# Patient Record
Sex: Male | Born: 1990 | Race: Black or African American | Hispanic: No | Marital: Single | Smoking: Never smoker
Health system: Southern US, Community
[De-identification: ages and names within clinical notes are randomized; demographics above are authoritative.]

---

## 2014-08-23 ENCOUNTER — Emergency Department (HOSPITAL_COMMUNITY): Payer: No Typology Code available for payment source

## 2014-08-23 ENCOUNTER — Encounter (HOSPITAL_COMMUNITY): Payer: Self-pay | Admitting: Emergency Medicine

## 2014-08-23 ENCOUNTER — Emergency Department (HOSPITAL_COMMUNITY)
Admission: EM | Admit: 2014-08-23 | Discharge: 2014-08-23 | Disposition: A | Discharge status: Home / Self Care | Payer: No Typology Code available for payment source | Attending: Emergency Medicine | Admitting: Emergency Medicine

## 2014-08-23 DIAGNOSIS — Y92310 Basketball court as the place of occurrence of the external cause: Secondary | ICD-10-CM | POA: Diagnosis not present

## 2014-08-23 DIAGNOSIS — Y998 Other external cause status: Secondary | ICD-10-CM | POA: Diagnosis not present

## 2014-08-23 DIAGNOSIS — W500XXA Accidental hit or strike by another person, initial encounter: Secondary | ICD-10-CM | POA: Diagnosis not present

## 2014-08-23 DIAGNOSIS — Y9367 Activity, basketball: Secondary | ICD-10-CM | POA: Diagnosis not present

## 2014-08-23 DIAGNOSIS — S8992XA Unspecified injury of left lower leg, initial encounter: Secondary | ICD-10-CM | POA: Diagnosis present

## 2014-08-23 DIAGNOSIS — M25569 Pain in unspecified knee: Secondary | ICD-10-CM

## 2014-08-23 DIAGNOSIS — S8392XA Sprain of unspecified site of left knee, initial encounter: Secondary | ICD-10-CM | POA: Diagnosis not present

## 2014-08-23 MED ORDER — NAPROXEN 500 MG PO TABS: 500.0000 mg | ORAL_TABLET | Freq: Two times a day (BID) | ORAL | Status: AC

## 2014-08-23 NOTE — Discharge Instructions (Signed)
Knee Sprain A knee sprain is a tear in one of the strong, fibrous tissues that connect the bones (ligaments) in your knee. The severity of the sprain depends on how much of the ligament is torn. The tear can be either partial or complete. CAUSES  Often, sprains are a result of a fall or injury. The force of the impact causes the fibers of your ligament to stretch too much. This excess tension causes the fibers of your ligament to tear. SIGNS AND SYMPTOMS  You may have some loss of motion in your knee. Other symptoms include:  Bruising.  Pain in the knee area.  Tenderness of the knee to the touch.  Swelling. DIAGNOSIS  To diagnose a knee sprain, your health care provider will physically examine your knee. Your health care provider may also suggest an X-ray exam of your knee to make sure no bones are broken. TREATMENT  If your ligament is only partially torn, treatment usually involves keeping the knee in a fixed position (immobilization) or bracing your knee for activities that require movement for several weeks. To do this, your health care provider will apply a bandage, cast, or splint to keep your knee from moving and to support your knee during movement until it heals. For a partially torn ligament, the healing process usually takes 4-6 weeks. If your ligament is completely torn, depending on which ligament it is, you may need surgery to reconnect the ligament to the bone or reconstruct it. After surgery, a cast or splint may be applied and will need to stay on your knee for 4-6 weeks while your ligament heals. HOME CARE INSTRUCTIONS  Keep your injured knee elevated to decrease swelling.  To ease pain and swelling, apply ice to the injured area:  Put ice in a plastic bag.  Place a towel between your skin and the bag.  Leave the ice on for 20 minutes, 2-3 times a day.  Only take medicine for pain as directed by your health care provider.  Do not leave your knee unprotected until  pain and stiffness go away (usually 4-6 weeks).  If you have a cast or splint, do not allow it to get wet. If you have been instructed not to remove it, cover it with a plastic bag when you shower or bathe. Do not swim.  Your health care provider may suggest exercises for you to do during your recovery to prevent or limit permanent weakness and stiffness. SEEK IMMEDIATE MEDICAL CARE IF:  Your cast or splint becomes damaged.  Your pain becomes worse.  You have significant pain, swelling, or numbness below the cast or splint. MAKE SURE YOU:  Understand these instructions.  Will watch your condition.  Will get help right away if you are not doing well or get worse. Document Released: 06/10/2005 Document Revised: 03/31/2013 Document Reviewed: 01/20/2013 [facility] Patient Information 2015 [place], [place]. This information is not intended to replace advice given to you by your health care provider. Make sure you discuss any questions you have with your health care provider.  Cryotherapy Cryotherapy means treatment with cold. Ice or gel packs can be used to reduce both pain and swelling. Ice is the most helpful within the first 24 to 48 hours after an injury or flare-up from overusing a muscle or joint. Sprains, strains, spasms, burning pain, shooting pain, and aches can all be eased with ice. Ice can also be used when recovering from surgery. Ice is effective, has very few side effects, and is  safe for most people to use. PRECAUTIONS  Ice is not a safe treatment option for people with:  Raynaud phenomenon. This is a condition affecting small blood vessels in the extremities. Exposure to cold may cause your problems to return.  Cold hypersensitivity. There are many forms of cold hypersensitivity, including:  Cold urticaria. Red, itchy hives appear on the skin when the tissues begin to warm after being iced.  Cold erythema. This is a red, itchy rash caused by exposure to cold.  Cold  hemoglobinuria. Red blood cells break down when the tissues begin to warm after being iced. The hemoglobin that carry oxygen are passed into the urine because they cannot combine with blood proteins fast enough.  Numbness or altered sensitivity in the area being iced. If you have any of the following conditions, do not use ice until you have discussed cryotherapy with your caregiver:  Heart conditions, such as arrhythmia, angina, or chronic heart disease.  High blood pressure.  Healing wounds or open skin in the area being iced.  Current infections.  Rheumatoid arthritis.  Poor circulation.  Diabetes. Ice slows the blood flow in the region it is applied. This is beneficial when trying to stop inflamed tissues from spreading irritating chemicals to surrounding tissues. However, if you expose your skin to cold temperatures for too long or without the proper protection, you can damage your skin or nerves. Watch for signs of skin damage due to cold. HOME CARE INSTRUCTIONS Follow these tips to use ice and cold packs safely.  Place a dry or damp towel between the ice and skin. A damp towel will cool the skin more quickly, so you may need to shorten the time that the ice is used.  For a more rapid response, add gentle compression to the ice.  Ice for no more than 10 to 20 minutes at a time. The bonier the area you are icing, the less time it will take to get the benefits of ice.  Check your skin after 5 minutes to make sure there are no signs of a poor response to cold or skin damage.  Rest 20 minutes or more between uses.  Once your skin is numb, you can end your treatment. You can test numbness by very lightly touching your skin. The touch should be so light that you do not see the skin dimple from the pressure of your fingertip. When using ice, most people will feel these normal sensations in this order: cold, burning, aching, and numbness.  Do not use ice on someone who cannot  communicate their responses to pain, such as small children or people with dementia. HOW TO MAKE AN ICE PACK Ice packs are the most common way to use ice therapy. Other methods include ice massage, ice baths, and cryosprays. Muscle creams that cause a cold, tingly feeling do not offer the same benefits that ice offers and should not be used as a substitute unless recommended by your caregiver. To make an ice pack, do one of the following:  Place crushed ice or a bag of frozen vegetables in a sealable plastic bag. Squeeze out the excess air. Place this bag inside another plastic bag. Slide the bag into a pillowcase or place a damp towel between your skin and the bag.  Mix 3 parts water with 1 part rubbing alcohol. Freeze the mixture in a sealable plastic bag. When you remove the mixture from the freezer, it will be slushy. Squeeze out the excess air. Place this  bag inside another plastic bag. Slide the bag into a pillowcase or place a damp towel between your skin and the bag. SEEK MEDICAL CARE IF:  You develop white spots on your skin. This may give the skin a blotchy (mottled) appearance.  Your skin turns blue or pale.  Your skin becomes waxy or hard.  Your swelling gets worse. MAKE SURE YOU:   Understand these instructions.  Will watch your condition.  Will get help right away if you are not doing well or get worse. Document Released: 02/04/2011 Document Revised: 10/25/2013 Document Reviewed: 02/04/2011 [facility] Patient Information 2015 [place], [place]. This information is not intended to replace advice given to you by your health care provider. Make sure you discuss any questions you have with your health care provider.  Knee Bracing Knee braces are supports to help stabilize and protect an injured or painful knee. They come in many different styles. They should support and protect the knee without increasing the chance of other injuries to yourself or others. It is important not to  have a false sense of security when using a brace. Knee braces that help you to keep using your knee:  Do not restore normal knee stability under high stress forces.  May decrease some aspects of athletic performance. Some of the different types of knee braces are:  Prophylactic knee braces are designed to prevent or reduce the severity of knee injuries during sports that make injury to the knee more likely.  Rehabilitative knee braces are designed to allow protected motion of:  Injured knees.  Knees that have been treated with or without surgery. There is no evidence that the use of a supportive knee brace protects the graft following a successful anterior cruciate ligament (ACL) reconstruction. However, braces are sometimes used to:   Protect injured ligaments.  Control knee movement during the initial healing period. They may be used as part of the treatment program for the various injured ligaments or cartilage of the knee including the:  Anterior cruciate ligament.  Medial collateral ligament.  Medial or lateral cartilage (meniscus).  Posterior cruciate ligament.  Lateral collateral ligament. Rehabilitative knee braces are most commonly used:  During crutch-assisted walking right after injury.  During crutch-assisted walking right after surgery to repair the cartilage and/or cruciate ligament injury.  For a short period of time, 2-8 weeks, after the injury or surgery. The value of a rehabilitative brace as opposed to a cast or splint includes the:  Ability to adjust the brace for swelling.  Ability to remove the brace for examinations, icing, or showering.  Ability to allow for movement in a controlled range of motion. Functional knee braces give support to knees that have already been injured. They are designed to provide stability for the injured knee and provide protection after repair. Functional knee braces may not affect performance much. Lower extremity muscle  strengthening, flexibility, and improvement in technique are more important than bracing in treating ligamentous knee injuries. Functional braces are not a substitute for rehabilitation or surgical procedures. Unloader/off-loader braces are designed to provide pain relief in arthritic knees. Patients with wear and tear arthritis from growing old or from an old cartilage injury (osteoarthritis) of the knee, and bowlegged (varus) or knock-knee (valgus) deformities, often develop increased pain in the arthritic side due to increased loading. Unloader/off-loader braces are made to reduce uneven loading in such knees. There is reduction in bowing out movement in bowlegged knees when the correct unloader brace is used. Patients with advanced  osteoarthritis or severe varus or valgus alignment problems would not likely benefit from bracing. Patellofemoral braces help the kneecap to move smoothly and well centered over the end of the femur in the knee.  Most people who wear knee braces feel that they help. However, there is a lack of scientific evidence that knee braces are helpful at the level needed for athletic participation to prevent injury. In spite of this, athletes report an increase in knee stability, pain relief, performance improvement, and confidence during athletics when using a brace.  Different knee problems require different knee braces:  Your caregiver may suggest one kind of knee brace after knee surgery.  A caregiver may choose another kind of knee brace for support instead of surgery for some types of torn ligaments.  You may also need one for pain in the front of your knee that is not getting better with strengthening and flexibility exercises. Get your caregiver's advice if you want to try a knee brace. The caregiver will advise you on where to get them and provide a prescription when it is needed to fashion and/or fit the brace. Knee braces are the least important part of preventing knee  injuries or getting better following injury. Stretching, strengthening and technique improvement are far more important in caring for and preventing knee injuries. When strengthening your knee, increase your activities a little at a time so as not to develop injuries from overuse. Work out an exercise plan with your caregiver and/or physical therapist to get the best program for you. Do not let a knee brace become a crutch. Always remember, there are no braces which support the knee as well as your original ligaments and cartilage you were born with. Conditioning, proper warm-up, and stretching remain the most important parts of keeping your knees healthy. HOW TO USE A KNEE BRACE  During sports, knee braces should be used as directed by your caregiver.  Make sure that the hinges are where the knee bends.  Straps, tapes, or hook-and-loop tapes should be fastened around your leg as instructed.  You should check the placement of the brace during activities to make sure that it has not moved. Poorly positioned braces can hurt rather than help you.  To work well, a knee brace should be worn during all activities that put you at risk of knee injury.  Warm up properly before beginning athletic activities. HOME CARE INSTRUCTIONS  Knee braces often get damaged during normal use. Replace worn-out braces for maximum benefit.  Clean regularly with soap and water.  Inspect your brace often for wear and tear.  Cover exposed metal to protect others from injury.  Durable materials may cost more, but last longer. SEEK IMMEDIATE MEDICAL CARE IF:   Your knee seems to be getting worse rather than better.  You have increasing pain or swelling in the knee.  You have problems caused by the knee brace.  You have increased swelling or inflammation (redness or soreness) in your knee.  Your knee becomes warm and more painful and you develop an unexplained temperature over 101F (38.3C). MAKE SURE YOU:    Understand these instructions.  Will watch your condition.  Will get help right away if you are not doing well or get worse. See your caregiver, physical therapist, or orthopedic surgeon for additional information. Document Released: 08/31/2003 Document Revised: 10/25/2013 Document Reviewed: 12/07/2008 [facility] Patient Information 2015 [place]. This information is not intended to replace advice given to you by your health care provider.  Make sure you discuss any questions you have with your health care provider. ° °

## 2014-08-23 NOTE — ED Notes (Signed)
Pt states yesterday he was playing basketball and someone landed on top of his left. knee. Painful and difficult to flex knee. Denies ankle pain. Mild swelling noted around left knee.

## 2014-08-23 NOTE — ED Provider Notes (Signed)
CSN: [id number]     Arrival date & time 08/23/14  1053 History  This chart was scribed for non-physician practitioner, Arthor CaptainAbigail Adriel Desrosier, PA-C, working with Geoffery Lyonsouglas Delo, MD by Charline BillsEssence Howell, ED Scribe. This patient was seen in room TR09C/TR09C and the patient's care was started at 11:32 AM.   Chief Complaint  Patient presents with  . Knee Pain   The history is provided by the patient. No language interpreter was used.   HPI Comments: Christian Clayton is a 24 y.o. male, with no pertinent medical history, who presents to the Emergency Department complaining of constant, gradually worsening L knee pain onset yesterday. Pt states that he was playing basketball yesterday when someone landed on top of his L knee. Pt reports a sharp, numb sensation in his knee following the incident that lasted for a few minutes and joint swelling. Pt was able to finish the game with limping an reports that his knee popped twice during the game. Pain is exacerbated with walking, external rotation of the hip, bending and lifting. Pt is able to ambulate with a limp. He denies numbness/tingling in L foot. Pt also denies pain at rest.   History reviewed. No pertinent past medical history. History reviewed. No pertinent past surgical history. No family history on file. History  Substance Use Topics  . Smoking status: Never Smoker   . Smokeless tobacco: Not on file  . Alcohol Use: Yes    Review of Systems  Musculoskeletal: Positive for joint swelling and arthralgias.  Neurological: Positive for numbness.   Allergies  Review of patient's allergies indicates no known allergies.  Home Medications   Prior to Admission medications   Not on File   BP 133/87 mmHg  Pulse 70  Temp(Src) 98.2 F (36.8 C) (Oral)  Resp 16  Ht 6\' 3"  (1.905 m)  Wt 209 lb (94.802 kg)  BMI 26.12 kg/m2  SpO2 100% Physical Exam  Constitutional: He is oriented to person, place, and time. He appears well-developed and well-nourished. No  distress.  HENT:  Head: Normocephalic and atraumatic.  Eyes: Conjunctivae and EOM are normal.  Neck: Neck supple. No tracheal deviation present.  Cardiovascular: Normal rate.   Pulmonary/Chest: Effort normal. No respiratory distress.  Musculoskeletal: Normal range of motion.  No pain along lateral joint line or lateral collateral ligament. No bony tenderness. Pain in medial collateral ligament. Ligamentous and instability of the MCL.  Neurological: He is alert and oriented to person, place, and time.  Skin: Skin is warm and dry.  Psychiatric: He has a normal mood and affect. His behavior is normal.  Nursing note and vitals reviewed.  ED Course  Procedures (including critical care time) DIAGNOSTIC STUDIES: Oxygen Saturation is 100% on RA, normal by my interpretation.    COORDINATION OF CARE: 11:40 AM-Discussed treatment plan which includes XR, knee brace and follow-up with ortho with pt at bedside and pt agreed to plan.   Labs Review Labs Reviewed - No data to display  Imaging Review Dg Knee Complete 4 Views Left  08/23/2014   CLINICAL DATA:  Fall last evening with left knee pain, initially count  EXAM: LEFT KNEE - COMPLETE 4+ VIEW  COMPARISON:  None.  FINDINGS: There is no evidence of fracture, dislocation, or joint effusion. There is no evidence of arthropathy or other focal bone abnormality. Soft tissues are unremarkable.  IMPRESSION: No acute abnormality noted.   Electronically Signed   By: Alcide CleverMark  Lukens M.D.   On: 08/23/2014 11:54    EKG Interpretation  None      MDM   Final diagnoses:  Knee pain   [name] negative for obvious fracture or dislocation. Pain managed in ED. Pt advised to follow up with orthopedics if symptoms persist for possibility of missed fracture diagnosis. Patient given brace while in ED, conservative therapy recommended and discussed. Patient will be dc home & is agreeable with above plan.  I personally performed the services described in this  documentation, which was scribed in my presence. The recorded information has been reviewed and is accurate.        Arthor Captain, PA-C 08/23/14 1217  Geoffery Lyons, MD 08/23/14 [phone]

## 2016-01-10 ENCOUNTER — Ambulatory Visit (HOSPITAL_COMMUNITY)
Admission: EM | Admit: 2016-01-10 | Discharge: 2016-01-10 | Disposition: A | Discharge status: Home / Self Care | Payer: PRIVATE HEALTH INSURANCE | Attending: Emergency Medicine | Admitting: Emergency Medicine

## 2016-01-10 ENCOUNTER — Encounter (HOSPITAL_COMMUNITY): Payer: Self-pay | Admitting: Emergency Medicine

## 2016-01-10 DIAGNOSIS — R300 Dysuria: Secondary | ICD-10-CM | POA: Diagnosis not present

## 2016-01-10 DIAGNOSIS — Z202 Contact with and (suspected) exposure to infections with a predominantly sexual mode of transmission: Secondary | ICD-10-CM | POA: Insufficient documentation

## 2016-01-10 DIAGNOSIS — Z79899 Other long term (current) drug therapy: Secondary | ICD-10-CM | POA: Diagnosis not present

## 2016-01-10 MED ORDER — AZITHROMYCIN 250 MG PO TABS
1000.0000 mg | ORAL_TABLET | Freq: Once | ORAL | Status: AC
  Administered 2016-01-10: 1000 mg via ORAL

## 2016-01-10 MED ORDER — CEFTRIAXONE SODIUM 250 MG IJ SOLR
INTRAMUSCULAR | Status: AC
  Filled 2016-01-10: qty 250

## 2016-01-10 MED ORDER — CEFTRIAXONE SODIUM 250 MG IJ SOLR
250.0000 mg | Freq: Once | INTRAMUSCULAR | Status: AC
  Administered 2016-01-10: 250 mg via INTRAMUSCULAR

## 2016-01-10 MED ORDER — LIDOCAINE HCL (PF) 1 % IJ SOLN
INTRAMUSCULAR | Status: AC
  Filled 2016-01-10: qty 2

## 2016-01-10 MED ORDER — AZITHROMYCIN 250 MG PO TABS
ORAL_TABLET | ORAL | Status: AC
  Filled 2016-01-10: qty 4

## 2016-01-10 NOTE — Discharge Instructions (Signed)
Sexually Transmitted Disease °A sexually transmitted disease (STD) is a disease or infection that may be passed (transmitted) from person to person, usually during sexual activity. This may happen by way of saliva, semen, blood, vaginal mucus, or urine. Common STDs include: °· Gonorrhea. °· Chlamydia. °· Syphilis. °· HIV and AIDS. °· Genital herpes. °· Hepatitis B and C. °· Trichomonas. °· Human papillomavirus (HPV). °· Pubic lice. °· Scabies. °· Mites. °· Bacterial vaginosis. °WHAT ARE CAUSES OF STDs? °An STD may be caused by bacteria, a virus, or parasites. STDs are often transmitted during sexual activity if one person is infected. However, they may also be transmitted through nonsexual means. STDs may be transmitted after:  °· Sexual intercourse with an infected person. °· Sharing sex toys with an infected person. °· Sharing needles with an infected person or using unclean piercing or tattoo needles. °· Having intimate contact with the genitals, mouth, or rectal areas of an infected person. °· Exposure to infected fluids during birth. °WHAT ARE THE SIGNS AND SYMPTOMS OF STDs? °Different STDs have different symptoms. Some people may not have any symptoms. If symptoms are present, they may include: °· Painful or bloody urination. °· Pain in the pelvis, abdomen, vagina, anus, throat, or eyes. °· A skin rash, itching, or irritation. °· Growths, ulcerations, blisters, or sores in the genital and anal areas. °· Abnormal vaginal discharge with or without bad odor. °· Penile discharge in men. °· Fever. °· Pain or bleeding during sexual intercourse. °· Swollen glands in the groin area. °· Yellow skin and eyes (jaundice). This is seen with hepatitis. °· Swollen testicles. °· Infertility. °· Sores and blisters in the mouth. °HOW ARE STDs DIAGNOSED? °To make a diagnosis, your health care provider may: °· Take a medical history. °· Perform a physical exam. °· Take a sample of any discharge to examine. °· Swab the throat,  cervix, opening to the penis, rectum, or vagina for testing. °· Test a sample of your first morning urine. °· Perform blood tests. °· Perform a Pap test, if this applies. °· Perform a colposcopy. °· Perform a laparoscopy. °HOW ARE STDs TREATED? °Treatment depends on the STD. Some STDs may be treated but not cured. °· Chlamydia, gonorrhea, trichomonas, and syphilis can be cured with antibiotic medicine. °· Genital herpes, hepatitis, and HIV can be treated, but not cured, with prescribed medicines. The medicines lessen symptoms. °· Genital warts from HPV can be treated with medicine or by freezing, burning (electrocautery), or surgery. Warts may come back. °· HPV cannot be cured with medicine or surgery. However, abnormal areas may be removed from the cervix, vagina, or vulva. °· If your diagnosis is confirmed, your recent sexual partners need treatment. This is true even if they are symptom-free or have a negative culture or evaluation. They should not have sex until their health care providers say it is okay. °· Your health care provider may test you for infection again 3 months after treatment. °HOW CAN I REDUCE MY RISK OF GETTING AN STD? °Take these steps to reduce your risk of getting an STD: °· Use latex condoms, dental dams, and water-soluble lubricants during sexual activity. Do not use petroleum jelly or oils. °· Avoid having multiple sex partners. °· Do not have sex with someone who has other sex partners °· Do not have sex with anyone you do not know or who is at high risk for an STD. °· Avoid risky sex practices that can break your skin. °· Do not have sex   if you have open sores on your mouth or skin. °· Avoid drinking too much alcohol or taking illegal drugs. Alcohol and drugs can affect your judgment and put you in a vulnerable position. °· Avoid engaging in oral and anal sex acts. °· Get vaccinated for HPV and hepatitis. If you have not received these vaccines in the past, talk to your health care  provider about whether one or both might be right for you. °· If you are at risk of being infected with HIV, it is recommended that you take a prescription medicine daily to prevent HIV infection. This is called pre-exposure prophylaxis (PrEP). You are considered at risk if: °¨ You are a man who has sex with other men (MSM). °¨ You are a heterosexual man or woman and are sexually active with more than one partner. °¨ You take drugs by injection. °¨ You are sexually active with a partner who has HIV. °· Talk with your health care provider about whether you are at high risk of being infected with HIV. If you choose to begin PrEP, you should first be tested for HIV. You should then be tested every 3 months for as long as you are taking PrEP. °WHAT SHOULD I DO IF I THINK I HAVE AN STD? °· See your health care provider. °· Tell your sexual partner(s). They should be tested and treated for any STDs. °· Do not have sex until your health care provider says it is okay. °WHEN SHOULD I GET IMMEDIATE MEDICAL CARE? °Contact your health care provider right away if:  °· You have severe abdominal pain. °· You are a man and notice swelling or pain in your testicles. °· You are a woman and notice swelling or pain in your vagina. °  °This information is not intended to replace advice given to you by your health care provider. Make sure you discuss any questions you have with your health care provider. °  °Document Released: 08/31/2002 Document Revised: 07/01/2014 Document Reviewed: 12/29/2012 °[employer] Interactive Patient Education ©2016 [employer] ° °

## 2016-01-10 NOTE — ED Notes (Signed)
Patient reports burning with urination.  Symptoms started 2 days ago.  Patient reports a partner that he had unprotected sex with has chlamydia

## 2016-01-11 LAB — URINE CYTOLOGY ANCILLARY ONLY
Chlamydia: NEGATIVE
NEISSERIA GONORRHEA: NEGATIVE
Trichomonas: NEGATIVE

## 2016-01-11 NOTE — ED Provider Notes (Signed)
CSN: [id number]     Arrival date & time 01/10/16  1416 History   First MD Initiated Contact with Patient 01/10/16 1511     Chief Complaint  Patient presents with  . Exposure to STD   (Consider location/radiation/quality/duration/timing/severity/associated sxs/prior Treatment) HPI Comments: Patient presents with known exposure to Chlamydia. Patient reports only 1 partner who recently informed him she tested positive for Chlamydia. He often uses condoms but not with this encounter. He is also having symptoms- slight burning with urination but no penile discharge.   The history is provided by the patient.    History reviewed. No pertinent past medical history. History reviewed. No pertinent past surgical history. No family history on file. Social History  Substance Use Topics  . Smoking status: Never Smoker   . Smokeless tobacco: None  . Alcohol Use: Yes    Review of Systems  Genitourinary: Positive for dysuria. Negative for hematuria, discharge, penile swelling, scrotal swelling, genital sores and penile pain.    Allergies  Review of patient's allergies indicates no known allergies.  Home Medications   Prior to Admission medications   Medication Sig Start Date End Date Taking? Authorizing Provider  naproxen (NAPROSYN) 500 MG tablet Take 1 tablet (500 mg total) by mouth 2 (two) times daily with a meal. Patient not taking: Reported on 01/10/2016 08/23/14   Arthor CaptainAbigail Harris, PA-C   Meds Ordered and Administered this Visit   Medications  cefTRIAXone (ROCEPHIN) injection 250 mg (250 mg Intramuscular Given 01/10/16 1540)  azithromycin (ZITHROMAX) tablet 1,000 mg (1,000 mg Oral Given 01/10/16 1540)    BP 128/79 mmHg  Pulse 63  Temp(Src) 98.2 F (36.8 C) (Oral)  Resp 18  SpO2 98% No data found.   Physical Exam  Constitutional: He is oriented to person, place, and time. He appears well-developed and well-nourished.  Cardiovascular: Normal rate, regular rhythm and normal heart  sounds.   Neurological: He is alert and oriented to person, place, and time.  Skin: Skin is warm and dry.    ED Course  Procedures (including critical care time)  Labs Review Labs Reviewed  URINE CYTOLOGY ANCILLARY ONLY    Imaging Review No results found.   Visual Acuity Review  Right Eye Distance:   Left Eye Distance:   Bilateral Distance:    Right Eye Near:   Left Eye Near:    Bilateral Near:         MDM   1. Exposure to STD    Reviewed probability of Chlamydia and that patients are often co-infected with Gonorrhea so we will treat him for both. Urine sent for GC/Chlamydia and Trich. Patient declined blood work for HIV and Syphilis. Patient given Zithromax 1g pills today and Rocephin 250mg  IM today. Encouraged to use condoms with each sexual encounter. Follow-up pending lab results and if symptoms do not resolve within 1 week.      Sudie GrumblingAnn Berry Joetta Delprado, NP 01/11/16 1026

## 2016-01-13 ENCOUNTER — Encounter (HOSPITAL_COMMUNITY): Payer: Self-pay | Admitting: *Deleted

## 2016-01-13 ENCOUNTER — Ambulatory Visit (HOSPITAL_COMMUNITY)
Admission: EM | Admit: 2016-01-13 | Discharge: 2016-01-13 | Disposition: A | Discharge status: Home / Self Care | Payer: PRIVATE HEALTH INSURANCE | Attending: Family Medicine | Admitting: Family Medicine

## 2016-01-13 DIAGNOSIS — N341 Nonspecific urethritis: Secondary | ICD-10-CM

## 2016-01-13 DIAGNOSIS — N342 Other urethritis: Secondary | ICD-10-CM | POA: Insufficient documentation

## 2016-01-13 DIAGNOSIS — R300 Dysuria: Secondary | ICD-10-CM | POA: Diagnosis present

## 2016-01-13 LAB — POCT URINALYSIS DIP (DEVICE)
BILIRUBIN URINE: NEGATIVE
GLUCOSE, UA: NEGATIVE mg/dL
Ketones, ur: NEGATIVE mg/dL
LEUKOCYTES UA: NEGATIVE
Nitrite: NEGATIVE
Protein, ur: 30 mg/dL — AB
Specific Gravity, Urine: 1.025 (ref 1.005–1.030)
UROBILINOGEN UA: 0.2 mg/dL (ref 0.0–1.0)
pH: 6 (ref 5.0–8.0)

## 2016-01-13 MED ORDER — PHENAZOPYRIDINE HCL 95 MG PO TABS: 95.0000 mg | ORAL_TABLET | Freq: Three times a day (TID) | ORAL | Status: AC | PRN

## 2016-01-13 MED ORDER — DOXYCYCLINE HYCLATE 100 MG PO CAPS: 100.0000 mg | ORAL_CAPSULE | Freq: Two times a day (BID) | ORAL | Status: AC

## 2016-01-13 NOTE — ED Provider Notes (Signed)
CSN: [id number]     Arrival date & time 01/13/16  1445 History   First MD Initiated Contact with Patient 01/13/16 1457     Chief Complaint  Patient presents with  . Dysuria   (Consider location/radiation/quality/duration/timing/severity/associated sxs/prior Treatment) Patient is a 25 y.o. male presenting with male genitourinary complaint. The history is provided by the patient.  Male GU Problem Presenting symptoms: dysuria   Presenting symptoms: no penile discharge   Presenting symptoms comment:  Seen 7/19 and treated with roc and zithro but std test reuslts were neg, urethral sx continue, no sex since last visit. Context: after urination   Worsened by:  Urination Associated symptoms: no fever     History reviewed. No pertinent past medical history. History reviewed. No pertinent past surgical history. No family history on file. Social History  Substance Use Topics  . Smoking status: Never Smoker   . Smokeless tobacco: None  . Alcohol Use: Yes     Comment: occasionally    Review of Systems  Constitutional: Negative for fever.  Genitourinary: Positive for dysuria. Negative for discharge.  All other systems reviewed and are negative.   Allergies  Review of patient's allergies indicates no known allergies.  Home Medications   Prior to Admission medications   Medication Sig Start Date End Date Taking? Authorizing Provider  doxycycline (VIBRAMYCIN) 100 MG capsule Take 1 capsule (100 mg total) by mouth 2 (two) times daily. 01/13/16   Linna Hoff, MD  naproxen (NAPROSYN) 500 MG tablet Take 1 tablet (500 mg total) by mouth 2 (two) times daily with a meal. Patient not taking: Reported on 01/10/2016 08/23/14   Arthor Captain, PA-C  phenazopyridine (URISTAT) 95 MG tablet Take 1 tablet (95 mg total) by mouth 3 (three) times daily as needed for pain. 01/13/16   Linna Hoff, MD   Meds Ordered and Administered this Visit  Medications - No data to display  BP 121/63 mmHg  Pulse 63   Temp(Src) 98 F (36.7 C) (Oral)  Resp 12  SpO2 100% No data found.   Physical Exam  Constitutional: He is oriented to person, place, and time. He appears well-developed and well-nourished.  Neurological: He is alert and oriented to person, place, and time.  Skin: Skin is warm and dry.  Nursing note and vitals reviewed.   ED Course  Procedures (including critical care time)  Labs Review Labs Reviewed  POCT URINALYSIS DIP (DEVICE) - Abnormal; Notable for the following:    Hgb urine dipstick TRACE (*)    Protein, ur 30 (*)    All other components within normal limits  URINE CYTOLOGY ANCILLARY ONLY   U/a neg.  Imaging Review No results found.   Visual Acuity Review  Right Eye Distance:   Left Eye Distance:   Bilateral Distance:    Right Eye Near:   Left Eye Near:    Bilateral Near:         MDM   1. Urethritis, nonspecific        Linna Hoff, MD 01/13/16 1540

## 2016-01-13 NOTE — ED Notes (Signed)
Assessment per Dr. Kindl. 

## 2016-01-15 LAB — URINE CYTOLOGY ANCILLARY ONLY
Chlamydia: NEGATIVE
NEISSERIA GONORRHEA: NEGATIVE
TRICH (WINDOWPATH): NEGATIVE

## 2016-10-28 IMAGING — DX DG KNEE COMPLETE 4+V*L*
4 series · 4 of 4 positions shown · non-contrast
Comparison: None.

CLINICAL DATA: Fall last evening with left knee pain, initially
count

EXAM:
LEFT KNEE - COMPLETE 4+ VIEW

[knee ap]
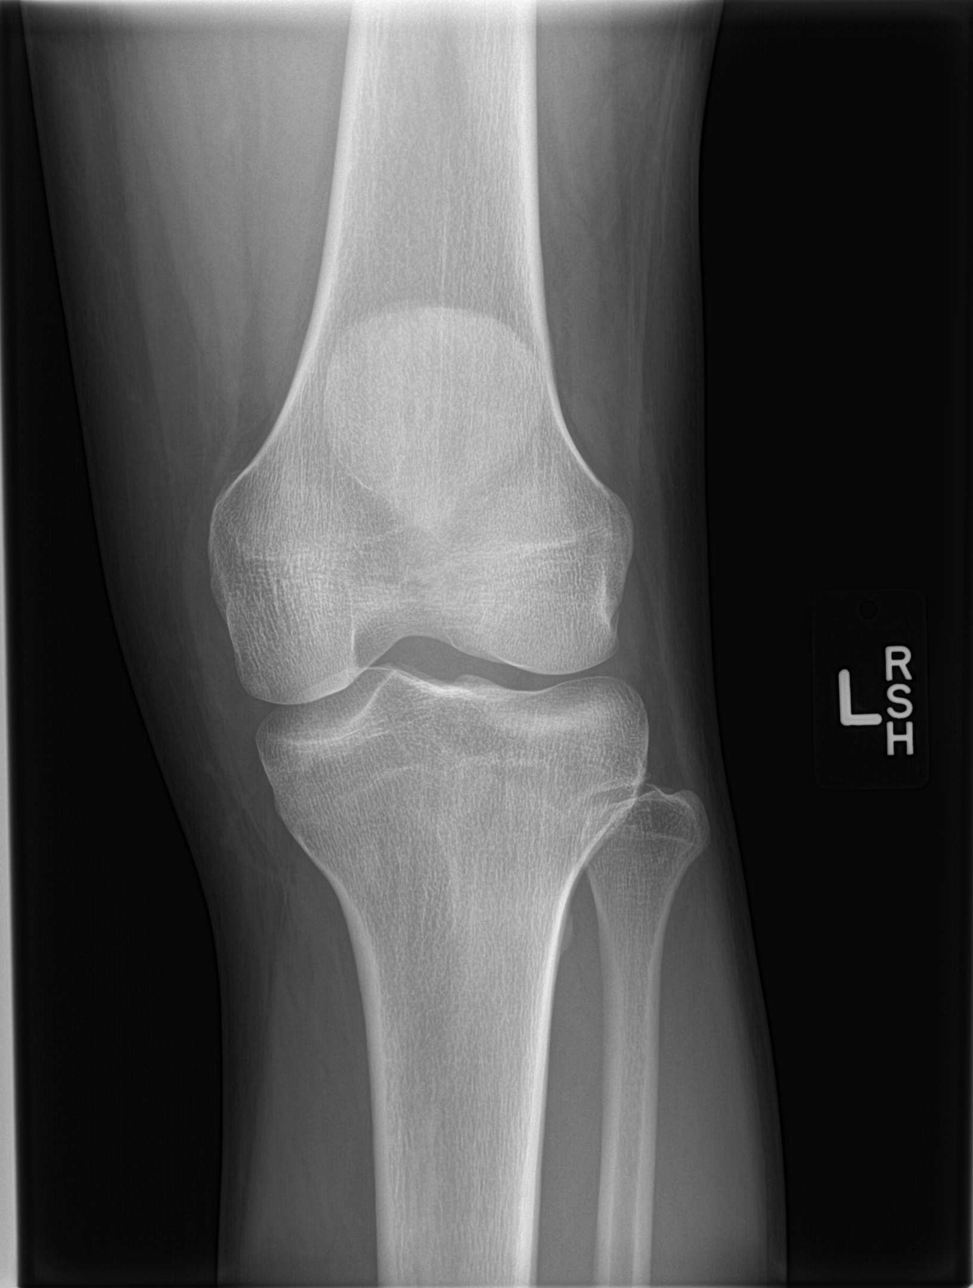

[knee obl (1 of 2)]
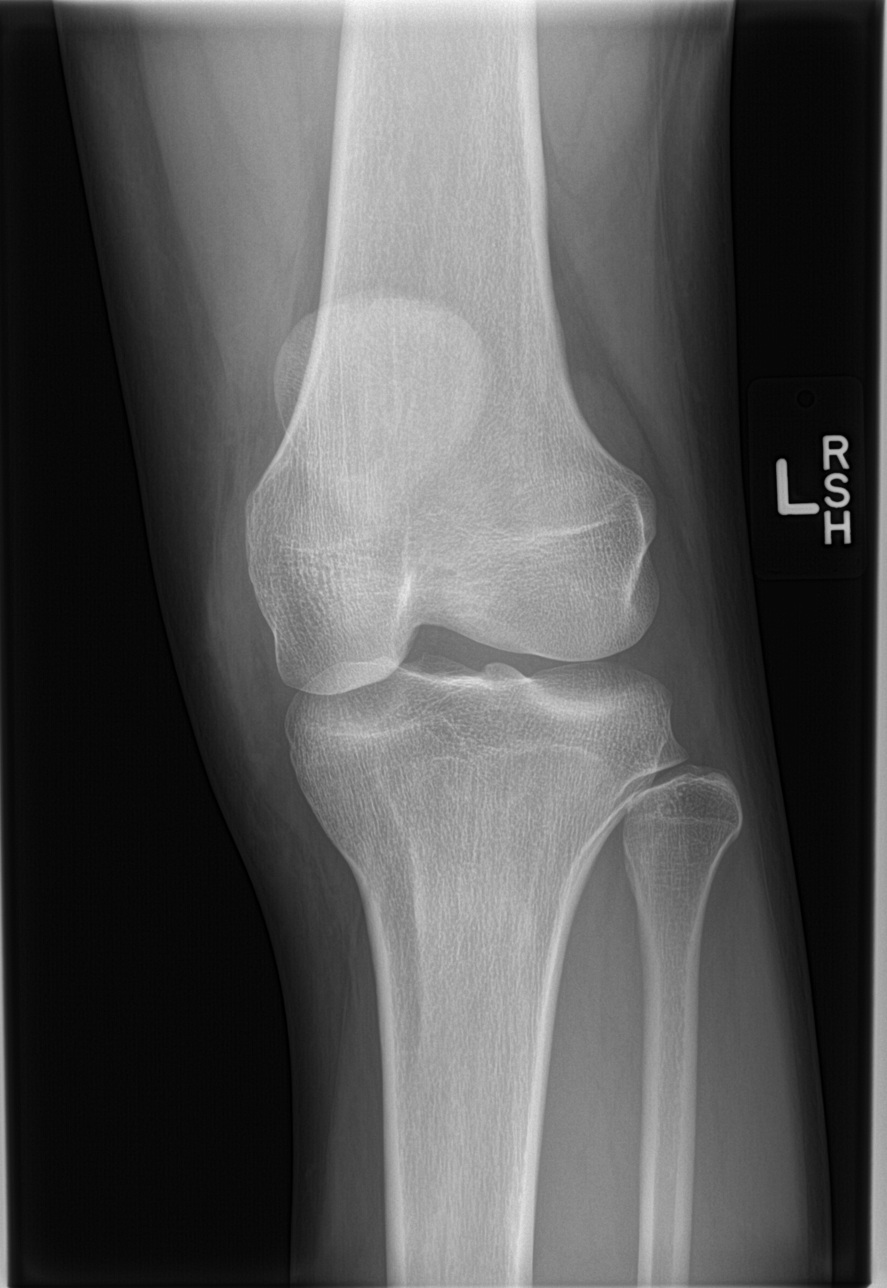

[knee obl (2 of 2)]
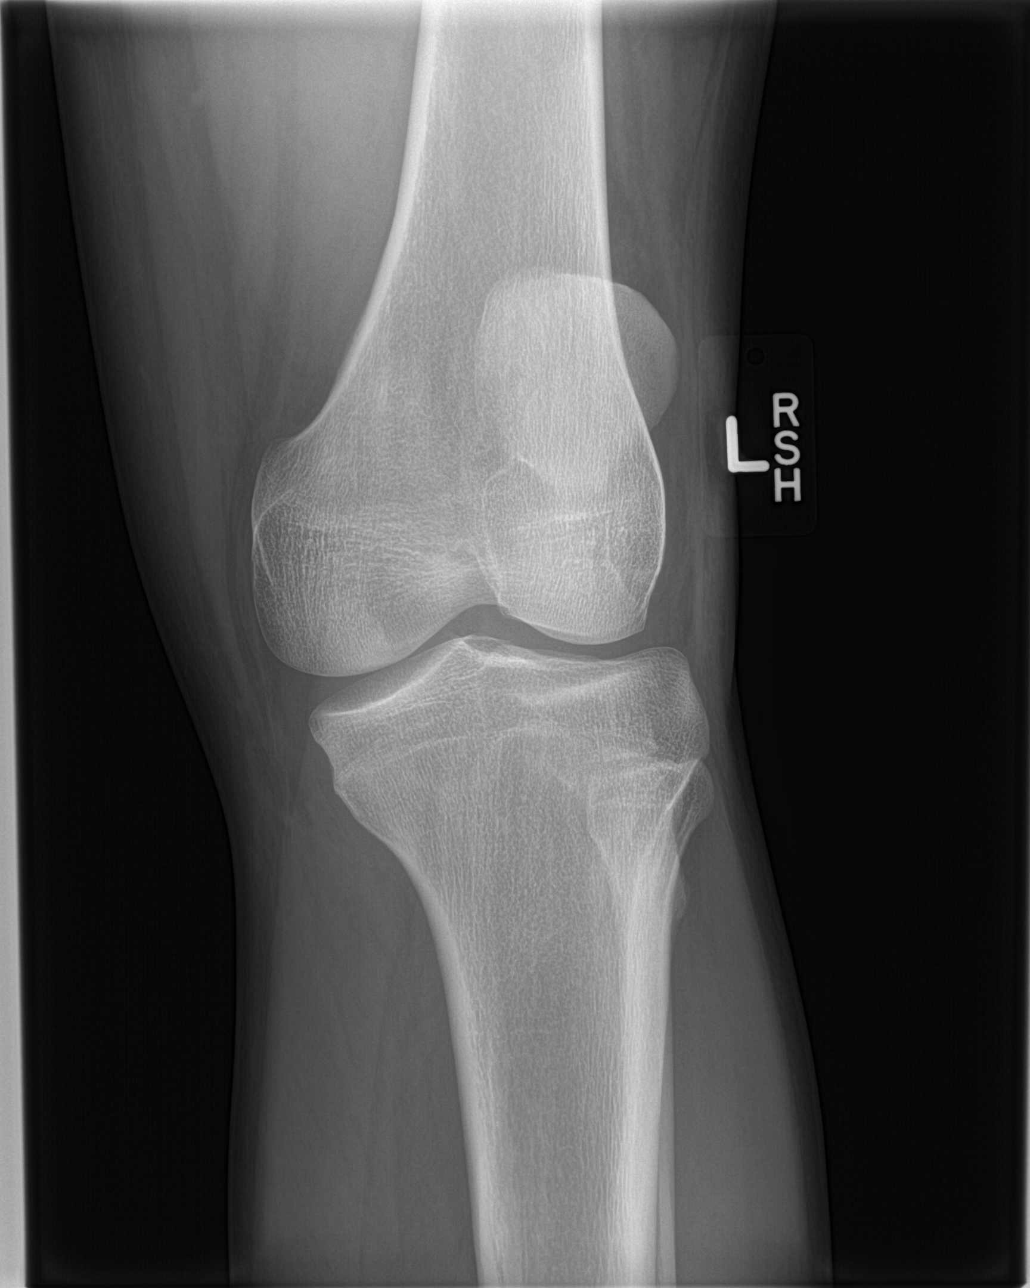

[knee lat]
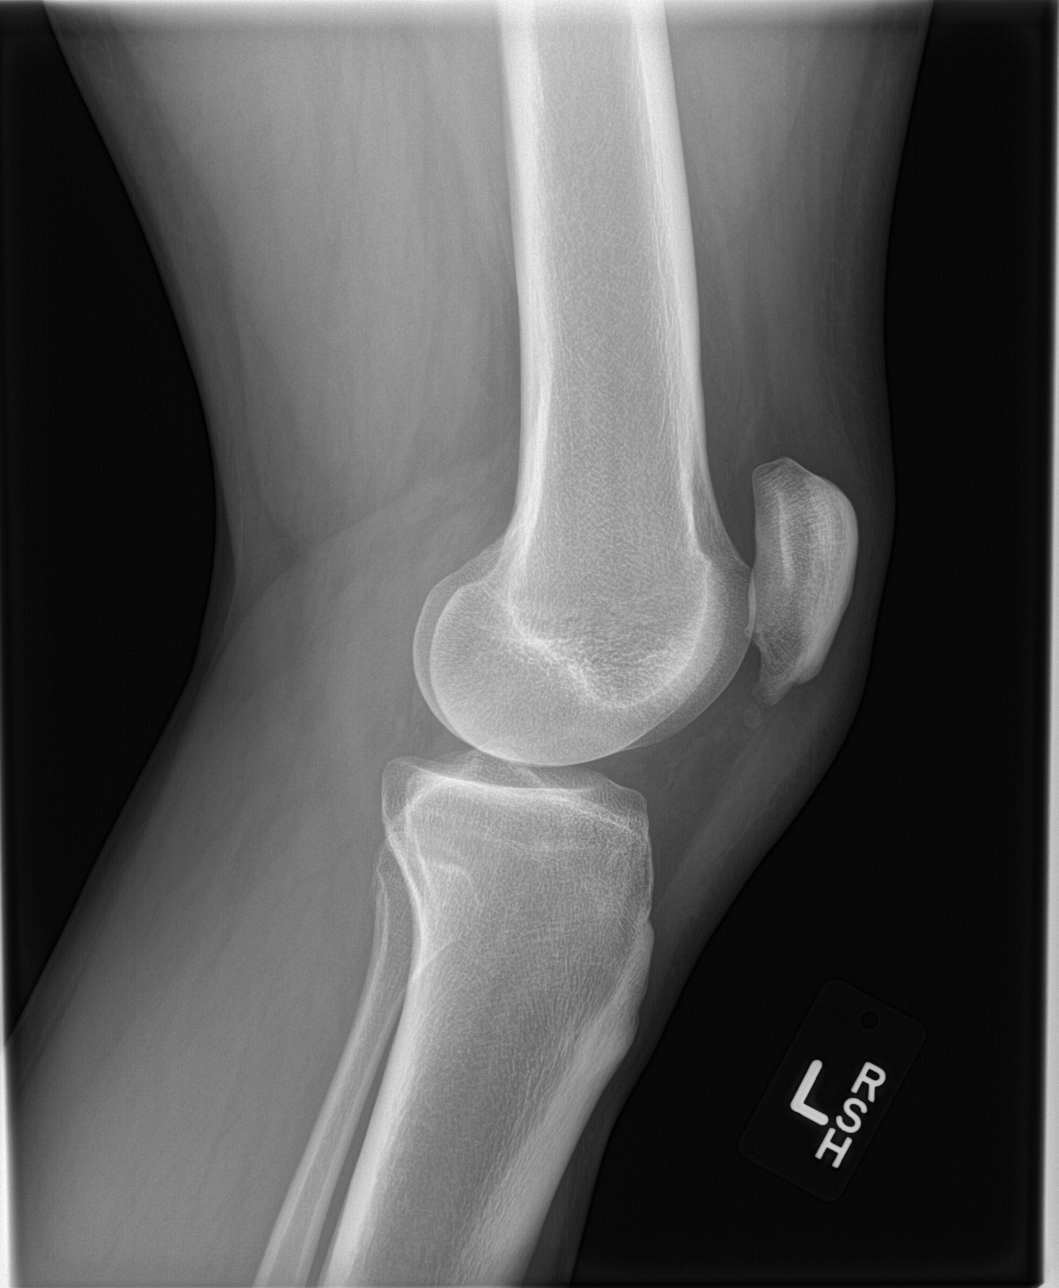

[4 of 4 positions shown; findings below may reference images not displayed]

FINDINGS: There is no evidence of fracture, dislocation, or joint effusion.
There is no evidence of arthropathy or other focal bone abnormality.
Soft tissues are unremarkable.
IMPRESSION: No acute abnormality noted.
